# Patient Record
Sex: Male | Born: 1970 | Race: White | Hispanic: No | Marital: Single
Health system: Southern US, Community
[De-identification: ages and names within clinical notes are randomized; demographics above are authoritative.]

## PROBLEM LIST (undated history)

## (undated) ENCOUNTER — Emergency Department (HOSPITAL_COMMUNITY): Admission: EM | Payer: Self-pay | Source: Home / Self Care

---

## 2020-05-09 ENCOUNTER — Emergency Department
Admission: EM | Admit: 2020-05-09 | Discharge: 2020-05-09 | Disposition: A | Discharge status: Home / Self Care | Payer: Self-pay | Attending: Emergency Medicine | Admitting: Emergency Medicine

## 2020-05-09 ENCOUNTER — Emergency Department: Payer: Self-pay

## 2020-05-09 ENCOUNTER — Other Ambulatory Visit: Payer: Self-pay

## 2020-05-09 DIAGNOSIS — Z87891 Personal history of nicotine dependence: Secondary | ICD-10-CM | POA: Insufficient documentation

## 2020-05-09 DIAGNOSIS — R0789 Other chest pain: Secondary | ICD-10-CM | POA: Insufficient documentation

## 2020-05-09 LAB — CBC
HCT: 42.5 % (ref 39.0–52.0)
Hemoglobin: 15.2 g/dL (ref 13.0–17.0)
MCH: 32.7 pg (ref 26.0–34.0)
MCHC: 35.8 g/dL (ref 30.0–36.0)
MCV: 91.4 fL (ref 80.0–100.0)
Platelets: 253 10*3/uL (ref 150–400)
RBC: 4.65 MIL/uL (ref 4.22–5.81)
RDW: 13.1 % (ref 11.5–15.5)
WBC: 9.5 10*3/uL (ref 4.0–10.5)
nRBC: 0 % (ref 0.0–0.2)

## 2020-05-09 LAB — BASIC METABOLIC PANEL
Anion gap: 8 (ref 5–15)
BUN: 21 mg/dL — ABNORMAL HIGH (ref 6–20)
CO2: 24 mmol/L (ref 22–32)
Calcium: 8.7 mg/dL — ABNORMAL LOW (ref 8.9–10.3)
Chloride: 104 mmol/L (ref 98–111)
Creatinine, Ser: 0.97 mg/dL (ref 0.61–1.24)
GFR calc Af Amer: >60 mL/min (ref >60)
GFR calc non Af Amer: >60 mL/min (ref >60)
Glucose, Bld: 145 mg/dL — ABNORMAL HIGH (ref 70–99)
Potassium: 4.2 mmol/L (ref 3.5–5.1)
Sodium: 136 mmol/L (ref 135–145)

## 2020-05-09 LAB — TROPONIN I (HIGH SENSITIVITY)
Troponin I (High Sensitivity): 4 ng/L (ref <18)
Troponin I (High Sensitivity): <2 ng/L (ref <18)

## 2020-05-09 NOTE — ED Triage Notes (Signed)
CP X 2 days with activity. Went to ED yesterday in [place] but not seen due to wait. Denies SOB. Pt alert and oriented X4, cooperative, RR even and unlabored, color WNL. Pt in NAD.

## 2020-05-09 NOTE — ED Provider Notes (Signed)
[facility] Emergency Department Provider Note ____________________________________________   First MD Initiated Contact with Patient 05/09/20 1431     (approximate)  I have reviewed the triage vital signs and the nursing notes.  HISTORY  Chief Complaint Chest Pain   HPI Mark Marks is a 49 y.o. male who presents to ED for evaluation of chest pain.  Chart review indicates no significant medical history. Patient reports a 15-pack-year smoking history, otherwise no relevant medical history.  No history of DM, HTN, no familial history of early cardiac death or MI.  Patient reports a significant of stress at work, and wife agrees that he has been "more stressed than normal."  Patient reports 2 days of intermittent substernal chest pain that occurs both at rest and with exertion, without any associated symptoms.  Denies associated syncope, nausea, diaphoresis, vomiting, shortness of breath, cough or fever.  No recent illnesses.  Patient denies any chest pain at this time.  Reports his last episode of chest pain was "hours ago" this morning as he was laying in bed and getting ready to get up for the day. When the pain does occur, it is substernal, and aching pressure in nature, 5/10 intensity, lasting minutes before self resolving.  He is not taking medications to assist with his pain.    History reviewed. No pertinent past medical history.  There are no problems to display for this patient.   History reviewed. No pertinent surgical history.  Prior to Admission medications   Not on File    Allergies Patient has no known allergies.  No family history on file.  Social History Social History   Tobacco Use  . Smoking status: Not on file  Substance Use Topics  . Alcohol use: Not on file  . Drug use: Not on file    Review of Systems  Constitutional: No fever/chills Eyes: No visual changes. ENT: No sore throat. Cardiovascular: Positive for  chest pain Respiratory: Denies shortness of breath. Gastrointestinal: No abdominal pain.  No nausea, no vomiting.  No diarrhea.  No constipation. Genitourinary: Negative for dysuria. Musculoskeletal: Negative for back pain. Skin: Negative for rash. Neurological: Negative for headaches, focal weakness or numbness.   ____________________________________________   PHYSICAL EXAM:  VITAL SIGNS: Vitals:   05/09/20 0959 05/09/20 1550  BP: (!) 142/105 (!) 142/92  Pulse: 66 88  Resp: 16 18  Temp: 98.6 F (37 C)   SpO2: 100% 100%      Constitutional: Alert and oriented. Well appearing and in no acute distress.  Sitting up in bed, asking when he can leave. Eyes: Conjunctivae are normal. PERRL. EOMI. Head: Atraumatic. Nose: No congestion/rhinnorhea. Mouth/Throat: Mucous membranes are moist.  Oropharynx non-erythematous. Neck: No stridor. No cervical spine tenderness to palpation. Cardiovascular: Normal rate, regular rhythm. Grossly normal heart sounds.  Good peripheral circulation. Respiratory: Normal respiratory effort.  No retractions. Lungs CTAB. Gastrointestinal: Soft , nondistended, nontender to palpation. No abdominal bruits. No CVA tenderness. Musculoskeletal: No lower extremity tenderness nor edema.  No joint effusions. No signs of acute trauma. Neurologic:  Normal speech and language. No gross focal neurologic deficits are appreciated. No gait instability noted. Skin:  Skin is warm, dry and intact. No rash noted. Psychiatric: Mood and affect are normal. Speech and behavior are normal.  ____________________________________________   LABS (all labs ordered are listed, but only abnormal results are displayed)  Labs Reviewed  BASIC METABOLIC PANEL - Abnormal; Notable for the following components:      Result Value  Glucose, Bld 145 (*)    BUN 21 (*)    Calcium 8.7 (*)    All other components within normal limits  CBC  TROPONIN I (HIGH SENSITIVITY)  TROPONIN I (HIGH  SENSITIVITY)   ____________________________________________  12 Lead EKG Sinus rhythm, rate of 67 bpm, normal axis intervals.  No evidence of acute ischemia.  ____________________________________________  RADIOLOGY  ED MD interpretation: 2 view CXR without evidence of acute cardiopulmonary pathology  Official radiology report(s): DG Chest 2 View  Result Date: 05/09/2020 CLINICAL DATA:  Chest pain EXAM: CHEST - 2 VIEW COMPARISON:  None. FINDINGS: Heart and mediastinal contours are within normal limits. No focal opacities or effusions. No acute bony abnormality. IMPRESSION: No active cardiopulmonary disease. Electronically Signed   By: Charlett Nose M.D.   On: 05/09/2020 10:20    ____________________________________________   PROCEDURES and INTERVENTIONS  Procedure(s) performed (including Critical Care):  Procedures  Medications - No data to display  ____________________________________________   INITIAL IMPRESSION / ASSESSMENT AND PLAN / ED COURSE  49 year old man with history of cigarette smoking presenting with intermittent chest pains, without evidence of ACS or cardiac ischemia, amenable to outpatient management with PCP establishment for stress testing.  Normal vital signs on room air.  Reassuring exam without evidence of acute pathology.  Patient has no distress, has no chest pain here in the ED and no evidence of neurovascular deficits.  Reassuring blood work with low high-sensitivity troponin x2, with 2-hour interval.  Nonischemic EKG. CXR without evidence of acute cardiopulmonary pathology to contribute to his pain.  I advised patient that he should establish with a PCP to consider cardiac stress testing, and outpatient management as reviewed.  Return precautions for the ED were discussed.  Patient is medically stable for discharge home.     ____________________________________________   FINAL CLINICAL IMPRESSION(S) / ED DIAGNOSES  Final diagnoses:  Other chest pain   Smoking hx     ED Discharge Orders    None       Rhyen Mazariego   Note:  This document was prepared using Dragon voice recognition software and may include unintentional dictation errors.   Delton Prairie, MD 05/09/20 [phone]

## 2020-05-09 NOTE — Discharge Instructions (Signed)
You were seen in the ED because of your chest pain.  There is no evidence of heart attack, strain/damage in your heart, pneumonia or other problems.  While this is reassuring testing for today, he would benefit from stress testing on your heart to better look for strain and the potential for heart attack.  This can be done through her primary care physician, and have included information for [facility] clinic -call the attached number to get established with a primary care physician who can help arrange this.  This does not have to be done in an urgent timeframe, but should be done in the next few weeks-months.  If you develop any further chest pains, especially in combination with nausea/vomiting, passing out, and does not go away with sitting down, please return to the ED.

## 2020-05-09 NOTE — ED Triage Notes (Signed)
Pt in via EMS from home with c/o gradual onset of CP with activity. No pain with sitting but when he starts to do something it hurts. Pt also with some SOB. Pt took 324mg  of baby asa and pt is now pain free. #20g to left AC.

## 2021-09-01 IMAGING — CR DG CHEST 2V
2 series · 2 of 2 positions shown · non-contrast
Comparison: None.

CLINICAL DATA: Chest pain

EXAM:
CHEST - 2 VIEW

[chest pa]
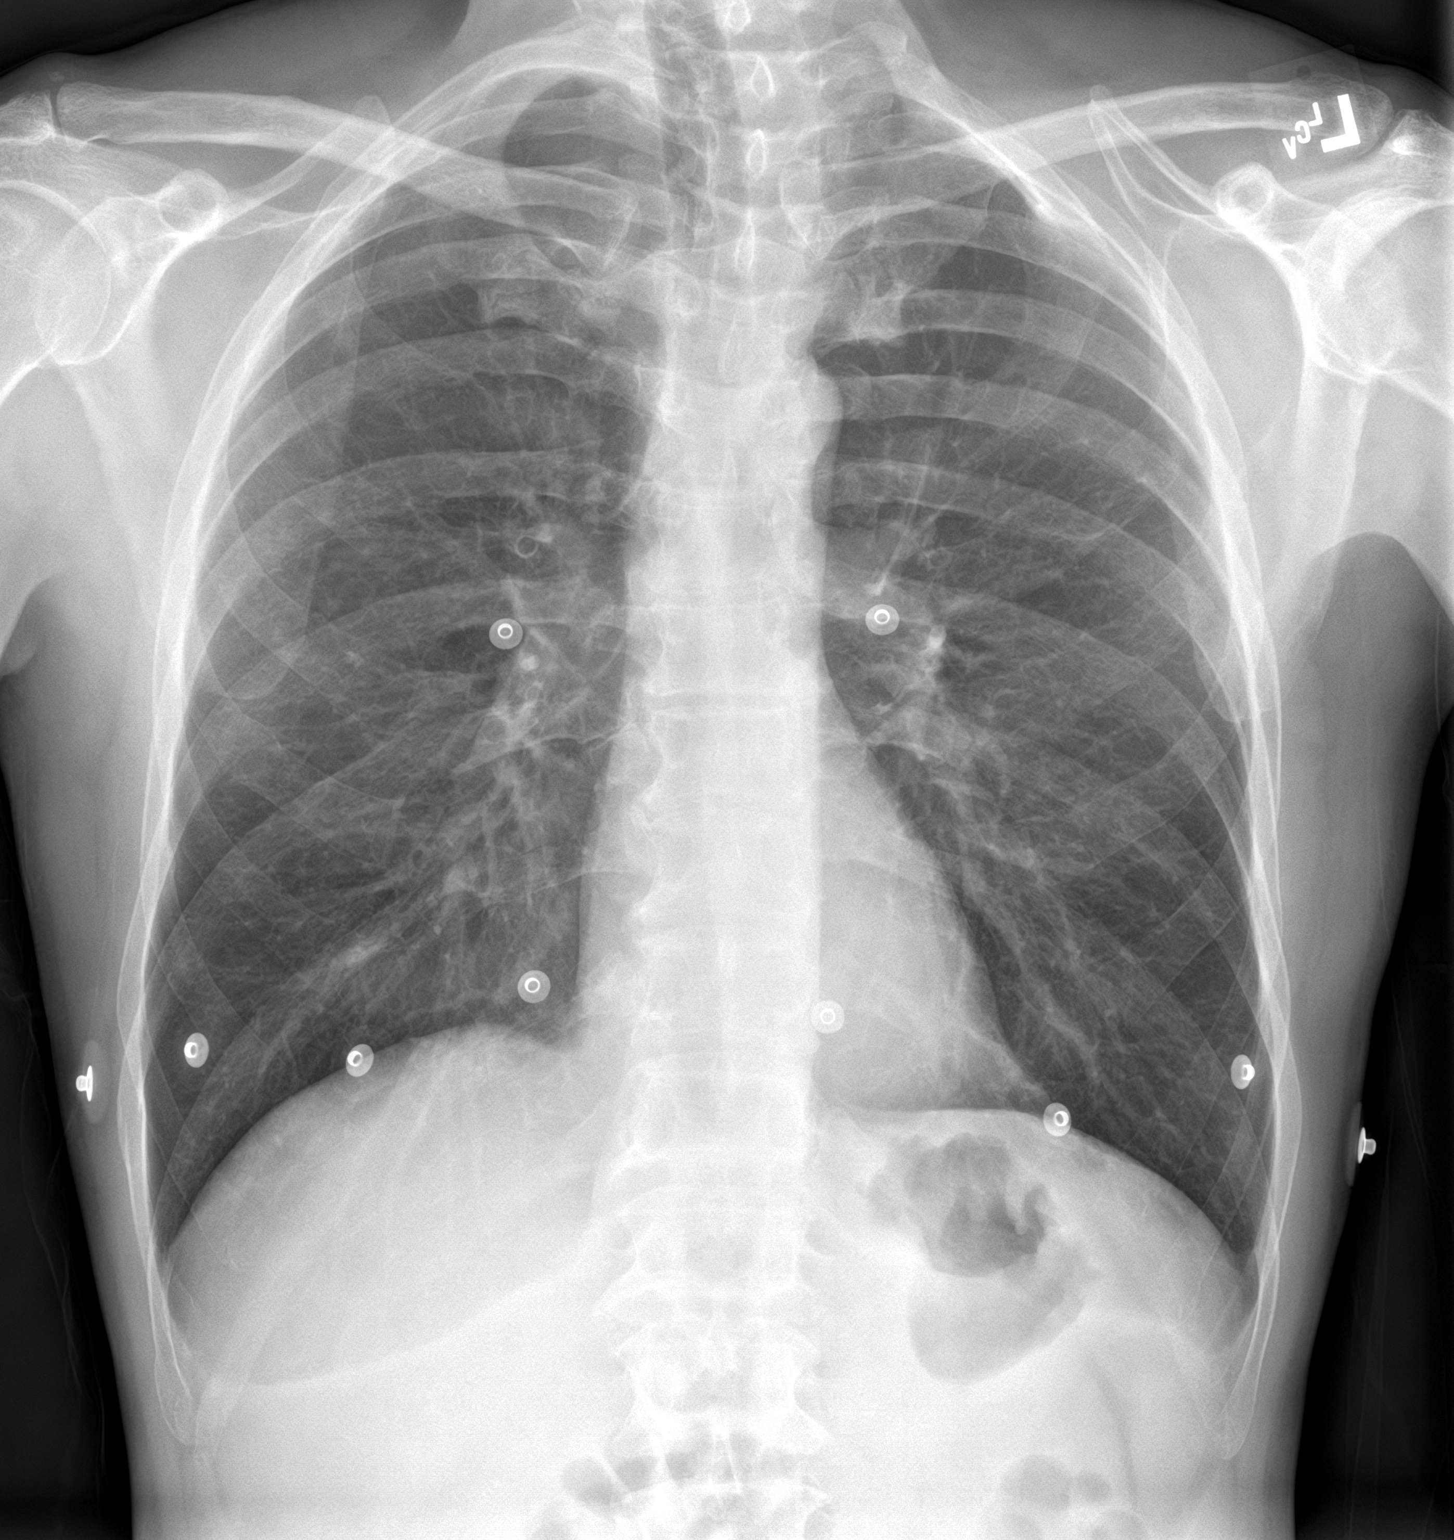

[chest lat]
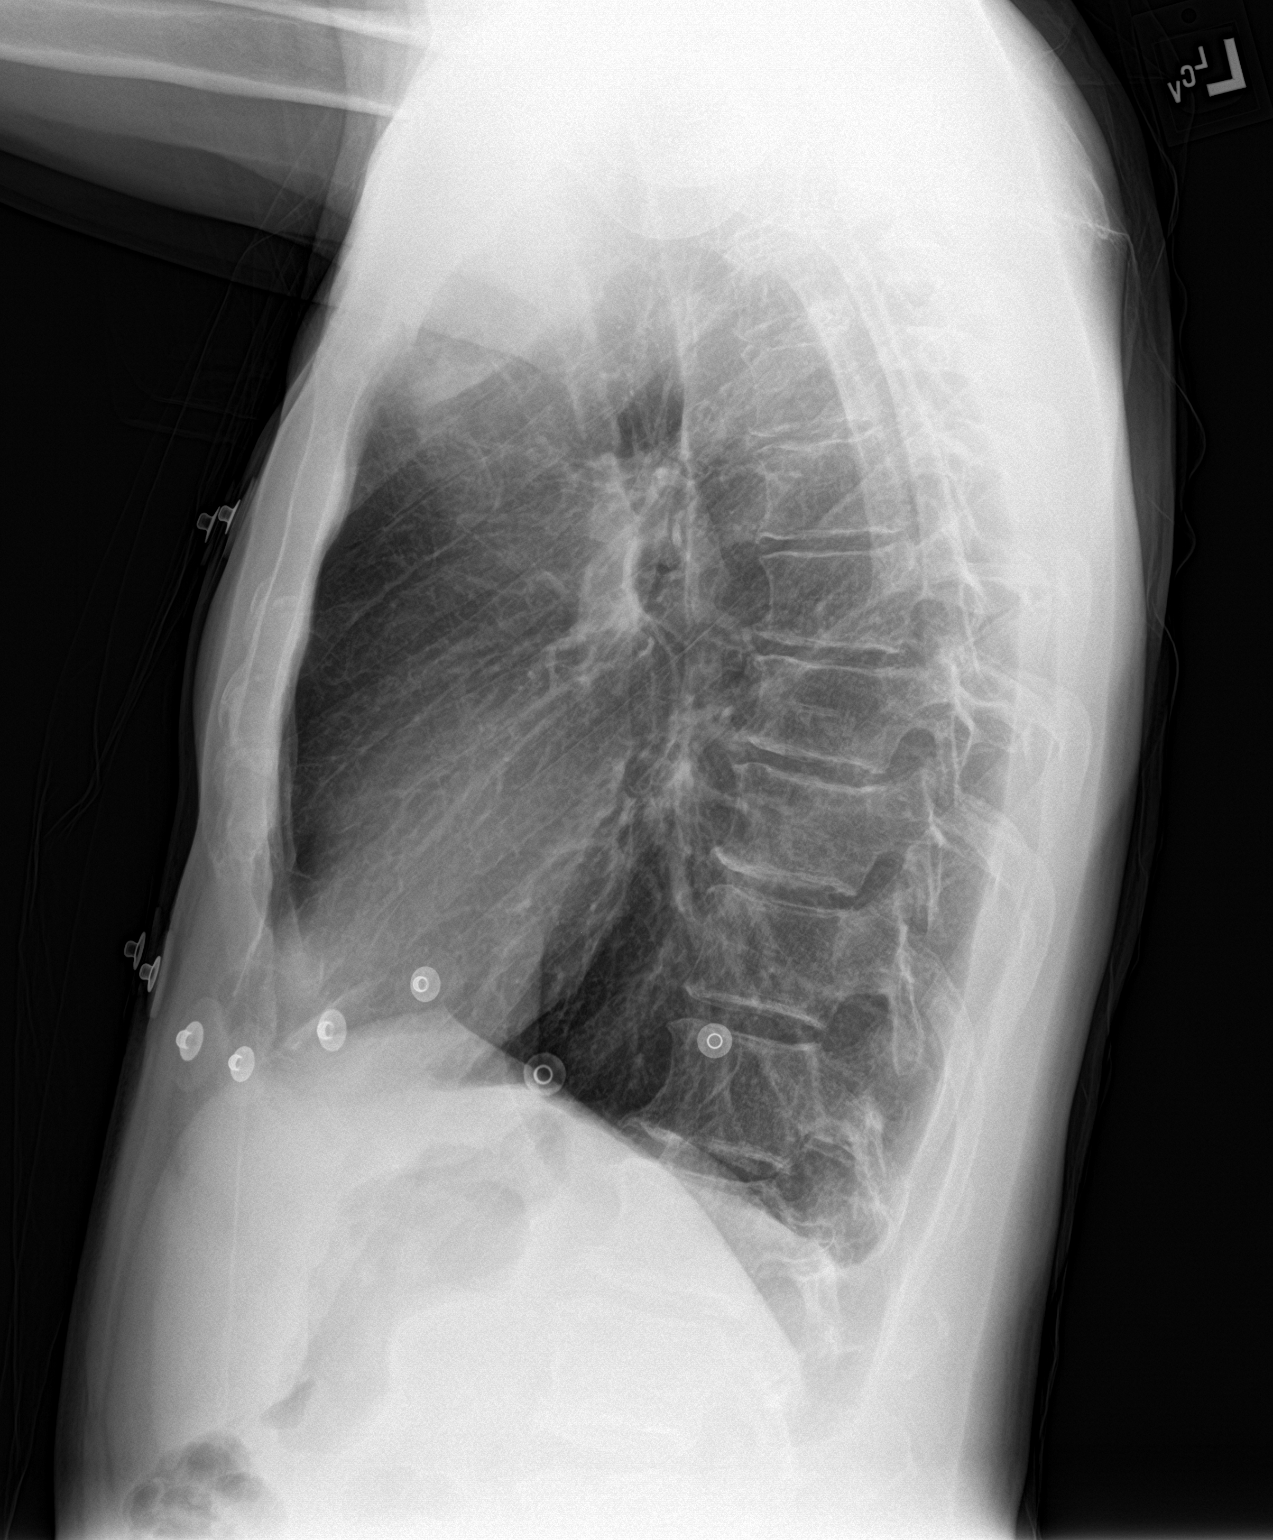

[2 of 2 positions shown; findings below may reference images not displayed]

FINDINGS: Heart and mediastinal contours are within normal limits. No focal
opacities or effusions. No acute bony abnormality.
IMPRESSION: No active cardiopulmonary disease.
